# Patient Record
Sex: Female | Born: 2000 | Race: White | Hispanic: No | Marital: Single | Smoking: Never smoker
Health system: Southern US, Community
[De-identification: ages and names within clinical notes are randomized; demographics above are authoritative.]

## PROBLEM LIST (undated history)

## (undated) DIAGNOSIS — F32A Depression, unspecified: Secondary | ICD-10-CM

## (undated) DIAGNOSIS — F329 Major depressive disorder, single episode, unspecified: Secondary | ICD-10-CM

## (undated) HISTORY — DX: Major depressive disorder, single episode, unspecified: F32.9

## (undated) HISTORY — DX: Depression, unspecified: F32.A

---
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
## 2021-02-27 ENCOUNTER — Ambulatory Visit: Payer: 59

## 2021-02-27 ENCOUNTER — Encounter: Payer: Self-pay | Admitting: Physician Assistant

## 2021-02-27 ENCOUNTER — Ambulatory Visit (INDEPENDENT_AMBULATORY_CARE_PROVIDER_SITE_OTHER): Payer: 59 | Admitting: Physician Assistant

## 2021-02-27 ENCOUNTER — Other Ambulatory Visit: Payer: Self-pay

## 2021-02-27 VITALS — BP 105/72 | HR 94 | Temp 98.2°F | Ht 66.0 in | Wt 124.4 lb

## 2021-02-27 DIAGNOSIS — F411 Generalized anxiety disorder: Secondary | ICD-10-CM

## 2021-02-27 DIAGNOSIS — Z3042 Encounter for surveillance of injectable contraceptive: Secondary | ICD-10-CM | POA: Diagnosis not present

## 2021-02-27 MED ORDER — MEDROXYPROGESTERONE ACETATE 150 MG/ML IM SUSP
150.0000 mg | Freq: Once | INTRAMUSCULAR | Status: AC
Start: 2021-02-27 — End: 2021-02-27
  Administered 2021-02-27: 150 mg via INTRAMUSCULAR

## 2021-02-27 NOTE — Progress Notes (Signed)
Established Patient Office Visit  Subjective:  Patient ID: Morgan Oconnell, female    DOB: 10/23/2000  Age: 20 y.o. MRN: [id number]  CC:  Chief Complaint  Patient presents with   Contraception    HPI Morgan Oconnell presents for follow up on mood. Patient reports anxiety is better. 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 States has a good support system at home (family and friends). Denies SI/HI. States she constantly worries especially with new job she started but overall managing fine. Also here for depo provera injection. Reports tolerating injections without issues.       Past Medical History:  Diagnosis Date   Depression     History reviewed. No pertinent surgical history.  Family History  Problem Relation Age of Onset   Depression Maternal Grandmother    Hyperlipidemia Maternal Grandmother    Alcohol abuse Maternal Grandfather    Healthy Mother    Cancer Cousin     Social History   Socioeconomic History   Marital status: Single    Spouse name: Not on file   Number of children: Not on file   Years of education: Not on file   Highest education level: Not on file  Occupational History   Not on file  Tobacco Use   Smoking status: Never   Smokeless tobacco: Never  Vaping Use   Vaping Use: Never used  Substance and Sexual Activity   Alcohol use: Never   Drug use: Never   Sexual activity: Never  Other Topics Concern   Not on file  Social History Narrative   Not on file   Social Determinants of Health   Financial Resource Strain: Not on file  Food Insecurity: Not on file  Transportation Needs: Not on file  Physical Activity: Not on file  Stress: Not on file  Social Connections: Not on file  Intimate Partner Violence: Not on file    Outpatient Medications Prior to Visit  Medication Sig Dispense Refill   medroxyPROGESTERone Acetate 150 MG/ML SUSY Inject 1 mL into the muscle every 3  (three) months.     triamcinolone cream (KENALOG) 0.1 % Apply 1 application topically 2 (two) times daily. 30 g 0   medroxyPROGESTERone (DEPO-PROVERA) 150 MG/ML injection Inject 1 mL (150 mg total) into the muscle every 3 (three) months. 1 mL 3   No facility-administered medications prior to visit.    No Known Allergies  ROS Review of Systems A fourteen system review of systems was performed and found to be positive as per HPI.  Objective:    Physical Exam General:  Well Developed, well nourished, appropriate for stated age.  Neuro:  Alert and oriented,  extra-ocular muscles intact  HEENT:  Normocephalic, atraumatic, neck supple Skin:  no gross rash, warm, pink. Cardiac:  RRR, S1 S2 Respiratory: CTA B/L, Not using accessory muscles, speaking in full sentences- unlabored. Vascular:  Ext warm, no cyanosis apprec.; cap RF less 2 sec. Psych:  No HI/SI, judgement and insight good, Euthymic mood. Full Affect.  BP 105/72   Pulse 94   Temp 98.2 F (36.8 C)   Ht '5\' 6"'  (1.676 m)   Wt 124 lb 6.4 oz (56.4 kg)   SpO2 98%   BMI 20.08 kg/m  Wt Readings from Last 3 Encounters:  02/27/21 124 lb 6.4 oz (56.4 kg) (44 %, Z= -0.15)*  12/12/20 122 lb 12.8 oz (55.7 kg) (42 %, Z= -0.21)*  09/20/20  121 lb 1.6 oz (54.9 kg) (39 %, Z= -0.27)*   * Growth percentiles are based on CDC (Girls, 2-20 Years) data.     Health Maintenance Due  Topic Date Due   HPV VACCINES (1 - 2-dose series) Never done   HIV Screening  Never done   Hepatitis C Screening  Never done   INFLUENZA VACCINE  Never done       Topic Date Due   HPV VACCINES (1 - 2-dose series) Never done    Lab Results  Component Value Date   TSH 3.840 09/12/2020   Lab Results  Component Value Date   WBC 4.9 09/12/2020   HGB 12.6 09/12/2020   HCT 37.1 09/12/2020   MCV 90 09/12/2020   PLT 277 03/11/2020   Lab Results  Component Value Date   NA 140 09/12/2020   K 3.8 09/12/2020   CO2 19 (L) 09/12/2020   GLUCOSE 35 (LL)  09/12/2020   BUN 8 09/12/2020   CREATININE 0.63 09/12/2020   BILITOT 0.3 09/12/2020   ALKPHOS 83 09/12/2020   AST 18 09/12/2020   ALT 11 09/12/2020   PROT 6.7 09/12/2020   ALBUMIN 4.8 09/12/2020   CALCIUM 9.1 09/12/2020   ANIONGAP 10 03/11/2020   EGFR 131 09/12/2020   No results found for: CHOL No results found for: HDL No results found for: LDLCALC No results found for: TRIG No results found for: CHOLHDL Lab Results  Component Value Date   HGBA1C 4.9 09/12/2020      Assessment & Plan:   Problem List Items Addressed This Visit       Other   GAD (generalized anxiety disorder) - Primary   Other Visit Diagnoses     Encounter for Depo-Provera contraception       Relevant Medications   medroxyPROGESTERone (DEPO-PROVERA) injection 150 mg (Completed)      GAD: -Surprisingly GAD-7 score today is higher than previously. Patient reports anxiety is better and during encounter patient is non-tearful and euthymic, clinical improvement when compared at last visit. Encourage to continue using good support system and recommend mindfulness therapy. Will continue to monitor.  Encounter for Depo-Provera contraception: -UTD annual exam and compliant with injections every 3 months. -Will continue to monitor.  Meds ordered this encounter  Medications   medroxyPROGESTERone (DEPO-PROVERA) injection 150 mg    Follow-up: Return in about 12 weeks (around 05/22/2021) for Next Depo injection; CPE in June 2023 .    Lorrene Reid, PA-C

## 2021-02-27 NOTE — Progress Notes (Signed)
HPI: Patient is here for a Depo Provera injection. Denies chest pain, shortness of breath, headaches, mood changes or problems with medication.   Assessment and Plan: Injection administered in the left glute. Patient tolerated injection well without complications. Patient advised to schedule next injection in 12 weeks.

## 2021-02-27 NOTE — Patient Instructions (Signed)

## 2021-05-22 ENCOUNTER — Other Ambulatory Visit: Payer: Self-pay

## 2021-05-22 ENCOUNTER — Other Ambulatory Visit: Payer: Self-pay | Admitting: Physician Assistant

## 2021-05-22 ENCOUNTER — Ambulatory Visit (INDEPENDENT_AMBULATORY_CARE_PROVIDER_SITE_OTHER): Payer: 59 | Admitting: Nurse Practitioner

## 2021-05-22 VITALS — BP 91/57 | HR 6 | Temp 98.7°F | Ht 66.5 in | Wt 130.0 lb

## 2021-05-22 DIAGNOSIS — Z3009 Encounter for other general counseling and advice on contraception: Secondary | ICD-10-CM | POA: Diagnosis not present

## 2021-05-22 MED ORDER — MEDROXYPROGESTERONE ACETATE 150 MG/ML IM SUSP
150.0000 mg | Freq: Once | INTRAMUSCULAR | Status: AC
  Administered 2021-05-22: 150 mg via INTRAMUSCULAR

## 2021-05-22 NOTE — Progress Notes (Signed)
Patient in office for Depo shot. Patient is within the window to receive the injection. Patient having no complications. Injection given, tolerated well. Patient advised to follow up 08/07/21-08/21/21 for next injection. AS, CMA

## 2021-08-16 ENCOUNTER — Ambulatory Visit: Payer: 59 | Admitting: Nurse Practitioner

## 2021-08-16 ENCOUNTER — Ambulatory Visit: Payer: 59

## 2021-08-22 ENCOUNTER — Encounter: Payer: Self-pay | Admitting: Nurse Practitioner

## 2021-08-22 ENCOUNTER — Ambulatory Visit (INDEPENDENT_AMBULATORY_CARE_PROVIDER_SITE_OTHER): Payer: 59 | Admitting: Nurse Practitioner

## 2021-08-22 VITALS — BP 100/65 | HR 74 | Temp 98.0°F | Ht 66.54 in | Wt 134.0 lb

## 2021-08-22 DIAGNOSIS — N926 Irregular menstruation, unspecified: Secondary | ICD-10-CM | POA: Diagnosis not present

## 2021-08-22 DIAGNOSIS — Z3009 Encounter for other general counseling and advice on contraception: Secondary | ICD-10-CM

## 2021-08-22 NOTE — Progress Notes (Signed)
Established patient visit   Patient: Morgan Oconnell   DOB: Jun 26, 2000   20 y.o. Female  MRN: [id number] Visit Date: 08/22/2021   Chief Complaint  Patient presents with   Contraception   Subjective    HPI  Patient states that she is due to have depo-provera injection today.  -has had spotting over the last few months.  -interested in trying other forms of birth control. Feels like this one is not working as it should  -states that she does have a boyfriend and they are sexually active. Not trying to conceive a pregnancy at this time.  -she has no other concerns or complaints today.    Medications: Outpatient Medications Prior to Visit  Medication Sig   medroxyPROGESTERone Acetate 150 MG/ML SUSY INJECT 1 ML INTO THE MUSCLE EVERY 3 MONTHS.   [DISCONTINUED] triamcinolone cream (KENALOG) 0.1 % Apply 1 application topically 2 (two) times daily.   No facility-administered medications prior to visit.    Review of Systems  Constitutional:  Negative for activity change, appetite change, chills, fatigue and fever.  HENT:  Negative for congestion, postnasal drip, rhinorrhea, sinus pressure, sinus pain, sneezing and sore throat.   Eyes: Negative.   Respiratory:  Negative for cough, chest tightness, shortness of breath and wheezing.   Cardiovascular:  Negative for chest pain and palpitations.  Gastrointestinal:  Negative for abdominal pain, constipation, diarrhea, nausea and vomiting.  Endocrine: Negative for cold intolerance, heat intolerance, polydipsia and polyuria.  Genitourinary:  Negative for dyspareunia, dysuria, flank pain, frequency and urgency.       Spotting and vaginal bleeding in between depo-provera injection .  Musculoskeletal:  Negative for arthralgias, back pain and myalgias.  Skin:  Negative for rash.  Allergic/Immunologic: Negative for environmental allergies.  Neurological:  Negative for dizziness, weakness and headaches.  Hematological:  Negative for adenopathy.   Psychiatric/Behavioral:  The patient is not nervous/anxious.     Objective     Today's Vitals   08/22/21 1527  BP: 100/65  Pulse: 74  Temp: 98 F (36.7 C)  SpO2: 100%  Weight: 134 lb (60.8 kg)  Height: 5' 6.54" (1.69 m)   Body mass index is 21.28 kg/m.   BP Readings from Last 3 Encounters:  08/22/21 100/65  05/22/21 (!) 91/57  02/27/21 105/72    Wt Readings from Last 3 Encounters:  08/22/21 134 lb (60.8 kg) (60 %, Z= 0.25)*  05/22/21 130 lb (59 kg) (54 %, Z= 0.10)*  02/27/21 124 lb 6.4 oz (56.4 kg) (44 %, Z= -0.15)*   * Growth percentiles are based on CDC (Girls, 2-20 Years) data.    Physical Exam Vitals and nursing note reviewed.  Constitutional:      Appearance: Normal appearance. She is well-developed.  HENT:     Head: Normocephalic.  Eyes:     Pupils: Pupils are equal, round, and reactive to light.  Cardiovascular:     Rate and Rhythm: Normal rate and regular rhythm.     Pulses: Normal pulses.     Heart sounds: Normal heart sounds.  Pulmonary:     Effort: Pulmonary effort is normal.     Breath sounds: Normal breath sounds.  Abdominal:     Palpations: Abdomen is soft.  Musculoskeletal:        General: Normal range of motion.     Cervical back: Normal range of motion and neck supple.  Lymphadenopathy:     Cervical: No cervical adenopathy.  Skin:    General: Skin is warm and dry.  Capillary Refill: Capillary refill takes less than 2 seconds.  Neurological:     General: No focal deficit present.     Mental Status: She is alert and oriented to person, place, and time.  Psychiatric:        Mood and Affect: Mood normal.        Behavior: Behavior normal.        Thought Content: Thought content normal.        Judgment: Judgment normal.      Assessment & Plan    1. Irregular menstrual bleeding The patient is having vaginal spotting/bleeding in between menstrual bleeding.  2. Birth control counseling The patient would like to stop Depo-Provera  injections at this time.  We have discussed different forms of birth control including transdermal patches, vaginal rings, birth control pills, and IUDs.  Patient states she would like to hold off birth control at this time.  Would like to follow-up in a few months to discuss further.  Problem List Items Addressed This Visit       Other   Irregular menstrual bleeding - Primary   Other Visit Diagnoses     Birth control counseling            Return in about 3 months (around 11/22/2021) for health maintenance exam, FBW a week prior to visit.         Carlean Jews, NP  [facility] [phone] (phone) [phone] (fax)  [facility]

## 2021-08-26 DIAGNOSIS — N926 Irregular menstruation, unspecified: Secondary | ICD-10-CM | POA: Insufficient documentation

## 2021-09-04 ENCOUNTER — Encounter: Payer: 59 | Admitting: Nurse Practitioner

## 2021-09-28 ENCOUNTER — Ambulatory Visit
Admission: EM | Admit: 2021-09-28 | Discharge: 2021-09-28 | Disposition: A | Discharge status: Home / Self Care | Payer: 59 | Attending: Physician Assistant | Admitting: Physician Assistant

## 2021-09-28 DIAGNOSIS — J069 Acute upper respiratory infection, unspecified: Secondary | ICD-10-CM | POA: Diagnosis not present

## 2021-09-28 DIAGNOSIS — R051 Acute cough: Secondary | ICD-10-CM

## 2021-09-28 MED ORDER — PREDNISONE 20 MG PO TABS: 40.0000 mg | ORAL_TABLET | Freq: Every day | ORAL | 0 refills | Status: AC

## 2021-09-28 MED ORDER — PROMETHAZINE-DM 6.25-15 MG/5ML PO SYRP: 5.0000 mL | ORAL_SOLUTION | Freq: Three times a day (TID) | ORAL | 0 refills | Status: DC | PRN

## 2021-09-28 NOTE — Discharge Instructions (Signed)
I believe that you have a virus.  Please monitor your MyChart for your results we will contact you if anything is positive.  Take prednisone burst.  Do not take NSAIDs including aspirin, ibuprofen/Advil, naproxen/Aleve with this medication.  You can use Tylenol, Mucinex, Flonase for additional symptom relief.  You can use Promethazine DM up to 3 times a day as needed for cough.  This can make you sleepy so do not drive or drink alcohol while taking it.  Make sure you rest and drink plenty of fluids.  If your symptoms are not improving within a week return for reevaluation.  If anything worsens return immediately.

## 2021-09-28 NOTE — ED Triage Notes (Signed)
Pt c/o coughing x 3 days. States today she had 2 "cough-gag fits" and was sent home from work.
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
## 2021-09-29 LAB — COVID-19, FLU A+B NAA
Influenza A, NAA: NOT DETECTED
Influenza B, NAA: NOT DETECTED
SARS-CoV-2, NAA: NOT DETECTED

## 2021-11-15 ENCOUNTER — Emergency Department (HOSPITAL_COMMUNITY): Payer: 59

## 2021-11-15 ENCOUNTER — Emergency Department (HOSPITAL_COMMUNITY)
Admission: EM | Admit: 2021-11-15 | Discharge: 2021-11-16 | Disposition: A | Discharge status: Home / Self Care | Payer: 59 | Attending: Emergency Medicine | Admitting: Emergency Medicine

## 2021-11-15 ENCOUNTER — Other Ambulatory Visit: Payer: Self-pay

## 2021-11-15 ENCOUNTER — Encounter (HOSPITAL_COMMUNITY): Payer: Self-pay | Admitting: Emergency Medicine

## 2021-11-15 DIAGNOSIS — Z5321 Procedure and treatment not carried out due to patient leaving prior to being seen by health care provider: Secondary | ICD-10-CM | POA: Diagnosis not present

## 2021-11-15 DIAGNOSIS — R109 Unspecified abdominal pain: Secondary | ICD-10-CM | POA: Insufficient documentation

## 2021-11-15 LAB — URINALYSIS, ROUTINE W REFLEX MICROSCOPIC
Bilirubin Urine: NEGATIVE
Glucose, UA: NEGATIVE mg/dL
Hgb urine dipstick: NEGATIVE
Ketones, ur: NEGATIVE mg/dL
Nitrite: NEGATIVE
Protein, ur: NEGATIVE mg/dL
Specific Gravity, Urine: 1.017 (ref 1.005–1.030)
pH: 5 (ref 5.0–8.0)

## 2021-11-15 LAB — CBC WITH DIFFERENTIAL/PLATELET
Abs Immature Granulocytes: 0.02 10*3/uL (ref 0.00–0.07)
Basophils Absolute: 0 10*3/uL (ref 0.0–0.1)
Basophils Relative: 1 %
Eosinophils Absolute: 0.1 10*3/uL (ref 0.0–0.5)
Eosinophils Relative: 1 %
HCT: 36.5 % (ref 36.0–46.0)
Hemoglobin: 12.3 g/dL (ref 12.0–15.0)
Immature Granulocytes: 0 %
Lymphocytes Relative: 30 %
Lymphs Abs: 2.2 10*3/uL (ref 0.7–4.0)
MCH: 30 pg (ref 26.0–34.0)
MCHC: 33.7 g/dL (ref 30.0–36.0)
MCV: 89 fL (ref 80.0–100.0)
Monocytes Absolute: 0.7 10*3/uL (ref 0.1–1.0)
Monocytes Relative: 9 %
Neutro Abs: 4.3 10*3/uL (ref 1.7–7.7)
Neutrophils Relative %: 59 %
Platelets: 235 10*3/uL (ref 150–400)
RBC: 4.1 MIL/uL (ref 3.87–5.11)
RDW: 12 % (ref 11.5–15.5)
WBC: 7.2 10*3/uL (ref 4.0–10.5)
nRBC: 0 % (ref 0.0–0.2)

## 2021-11-15 LAB — I-STAT BETA HCG BLOOD, ED (MC, WL, AP ONLY): I-stat hCG, quantitative: <5 m[IU]/mL (ref <5)

## 2021-11-15 LAB — COMPREHENSIVE METABOLIC PANEL
ALT: 25 U/L (ref 0–44)
AST: 26 U/L (ref 15–41)
Albumin: 4.3 g/dL (ref 3.5–5.0)
Alkaline Phosphatase: 83 U/L (ref 38–126)
Anion gap: 7 (ref 5–15)
BUN: 9 mg/dL (ref 6–20)
CO2: 23 mmol/L (ref 22–32)
Calcium: 9.3 mg/dL (ref 8.9–10.3)
Chloride: 109 mmol/L (ref 98–111)
Creatinine, Ser: 0.63 mg/dL (ref 0.44–1.00)
GFR, Estimated: >60 mL/min (ref >60)
Glucose, Bld: 83 mg/dL (ref 70–99)
Potassium: 3.8 mmol/L (ref 3.5–5.1)
Sodium: 139 mmol/L (ref 135–145)
Total Bilirubin: 0.2 mg/dL — ABNORMAL LOW (ref 0.3–1.2)
Total Protein: 6.8 g/dL (ref 6.5–8.1)

## 2021-11-15 LAB — LIPASE, BLOOD: Lipase: 26 U/L (ref 11–51)

## 2021-11-15 NOTE — ED Provider Triage Note (Signed)
Emergency Medicine Provider Triage Evaluation Note  Morgan Oconnell , a 21 y.o. female  was evaluated in triage.  Pt complains of flank pain. Acute onset of L flank pain since yesterday, worse with movement.  Does do some heavy lifting.  No hx of kidney stone, no fever, chills, n/v/d, abd pain, cp, sob, dysuria, hematuria or rash.  No trauma  Review of Systems  Positive: As above Negative: As above  Physical Exam  BP 123/80 (BP Location: Right Arm)   Pulse 86   Temp 98.6 F (37 C) (Oral)   Resp 16   SpO2 100%  Gen:   Awake, no distress   Resp:  Normal effort  MSK:   Moves extremities without difficulty  Other:    Medical Decision Making  Medically screening exam initiated at 5:02 PM.  Appropriate orders placed.  Justice Aguirre was informed that the remainder of the evaluation will be completed by another provider, this initial triage assessment does not replace that evaluation, and the importance of remaining in the ED until their evaluation is complete.     Fayrene Helper, PA-C 11/15/21 1708

## 2021-11-15 NOTE — ED Notes (Signed)
Pt called 3x no answer  

## 2021-11-15 NOTE — ED Triage Notes (Signed)
Pt with intensifying left flank pain since yesterday. Worse with eating/drinking but has continued to get worse.  No n/v/d shob or CP.  No urinary symptoms. No fever or chills.

## 2021-11-16 ENCOUNTER — Ambulatory Visit (INDEPENDENT_AMBULATORY_CARE_PROVIDER_SITE_OTHER): Payer: 59

## 2021-11-16 ENCOUNTER — Ambulatory Visit
Admission: EM | Admit: 2021-11-16 | Discharge: 2021-11-16 | Disposition: A | Discharge status: Home / Self Care | Payer: 59 | Attending: Physician Assistant | Admitting: Physician Assistant

## 2021-11-16 DIAGNOSIS — R109 Unspecified abdominal pain: Secondary | ICD-10-CM | POA: Diagnosis not present

## 2021-11-16 DIAGNOSIS — R0789 Other chest pain: Secondary | ICD-10-CM

## 2021-11-16 DIAGNOSIS — R10A2 Flank pain, left side: Secondary | ICD-10-CM

## 2021-11-16 DIAGNOSIS — R0781 Pleurodynia: Secondary | ICD-10-CM

## 2021-11-16 DIAGNOSIS — R0782 Intercostal pain: Secondary | ICD-10-CM

## 2021-11-16 LAB — POCT URINALYSIS DIP (MANUAL ENTRY)
Bilirubin, UA: NEGATIVE
Blood, UA: NEGATIVE
Glucose, UA: NEGATIVE mg/dL
Ketones, POC UA: NEGATIVE mg/dL
Leukocytes, UA: NEGATIVE
Nitrite, UA: NEGATIVE
Protein Ur, POC: NEGATIVE mg/dL
Spec Grav, UA: 1.02 (ref 1.010–1.025)
Urobilinogen, UA: 0.2 E.U./dL
pH, UA: 7 (ref 5.0–8.0)

## 2021-11-16 MED ORDER — IBUPROFEN 600 MG PO TABS: 600.0000 mg | ORAL_TABLET | Freq: Four times a day (QID) | ORAL | 0 refills | Status: DC | PRN

## 2021-11-16 MED ORDER — LIDOCAINE 5 % EX PTCH: 1.0000 | MEDICATED_PATCH | CUTANEOUS | 0 refills | Status: DC

## 2021-11-16 NOTE — Discharge Instructions (Signed)
Your x-ray was normal.  As we discussed, I am concerned about a musculoskeletal etiology since we are able to reproduce the pain on exam.  Start ibuprofen every 6 hours as needed.  Do not take NSAIDs with this medication including aspirin, ibuprofen/Advil, naproxen/Aleve as that will cause stomach bleeding.  Use lidocaine patches on this area for additional symptom relief.  You should put 1 patch on in the morning leave it on for 12 hours and then remove it for 12 hours at night (use only 1 patch per 24 hours).  If you have any worsening symptoms including worsening pain, fever, nausea, vomiting, urinary symptoms you need to go to the emergency room immediately.
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
## 2021-11-16 NOTE — ED Triage Notes (Signed)
Pt c/o left flank pain, pain with deep breathing,   Denies dysuria, frequency, urgency, pelvic pain, new lower back pain, hematuria.   Onset ~ Wednesday

## 2021-11-20 NOTE — Progress Notes (Unsigned)
Complete physical exam   Patient: Morgan Oconnell   DOB: 01-16-2001   21 y.o. Female  MRN: [id number] Visit Date: 11/21/2021    No chief complaint on file.  Subjective    Morgan Oconnell is a 21 y.o. female who presents today for a complete physical exam.  She reports consuming a {diet types:17450} diet. {Exercise:19826} She generally feels {well/fairly well/poorly:18703}. She {does/does not:200015} have additional problems to discuss today.   HPI  Annual physical.  -trial of OCP due to irregular menstrual cycle.  -was to have routine, fasting labs prior to this visit. Will get today if she is fasting.   Past Medical History:  Diagnosis Date   Depression    No past surgical history on file. Social History   Socioeconomic History   Marital status: Single    Spouse name: Not on file   Number of children: Not on file   Years of education: Not on file   Highest education level: Not on file  Occupational History   Not on file  Tobacco Use   Smoking status: Never   Smokeless tobacco: Never  Vaping Use   Vaping Use: Never used  Substance and Sexual Activity   Alcohol use: Never   Drug use: Never   Sexual activity: Never  Other Topics Concern   Not on file  Social History Narrative   Not on file   Social Determinants of Health   Financial Resource Strain: Not on file  Food Insecurity: Not on file  Transportation Needs: Not on file  Physical Activity: Not on file  Stress: Not on file  Social Connections: Unknown (03/10/2018)   Social Connection and Isolation Panel [NHANES]    Frequency of Communication with Friends and Family: Not on file    Frequency of Social Gatherings with Friends and Family: Not on file    Attends Religious Services: 1 to 4 times per year    Active Member of Clubs or Organizations: No    Attends Archivist Meetings: Never    Marital Status: Not on file  Intimate Partner Violence: Not on file   Family Status  Relation Name Status    MGM  (Not Specified)   MGF  (Not Specified)   Mother  Alive   Brother Barrie Dunker, age 68y   Brother Lenny Pastel, age 37y   Brother Marijo File, age 8y   Cousin  Alive   Family History  Problem Relation Age of Onset   Depression Maternal Grandmother    Hyperlipidemia Maternal Grandmother    Alcohol abuse Maternal Grandfather    Healthy Mother    Cancer Cousin    No Known Allergies  Patient Care Team: Ronnell Freshwater, NP as PCP - General (Family Medicine)   Medications: Outpatient Medications Prior to Visit  Medication Sig   ibuprofen (ADVIL) 600 MG tablet Take 1 tablet (600 mg total) by mouth every 6 (six) hours as needed.   lidocaine (LIDODERM) 5 % Place 1 patch onto the skin daily. Remove & Discard patch within 12 hours or as directed by MD   No facility-administered medications prior to visit.    Review of Systems  {Labs (Optional):23779}   Objective    There were no vitals taken for this visit. BP Readings from Last 3 Encounters:  11/16/21 96/64  11/15/21 133/77  09/28/21 100/70    Wt Readings from Last 3 Encounters:  08/22/21 134 lb (60.8 kg) (60 %, Z= 0.25)*  05/22/21 130 lb (59 kg) (54 %,  Z= 0.10)*  02/27/21 124 lb 6.4 oz (56.4 kg) (44 %, Z= -0.15)*   * Growth percentiles are based on CDC (Girls, 2-20 Years) data.     Physical Exam  ***  Last depression screening scores    08/22/2021    3:29 PM 02/27/2021    2:42 PM 12/12/2020    2:14 PM  PHQ 2/9 Scores  PHQ - 2 Score 1 1 0  PHQ- 9 Score '2 3 5   ' Last fall risk screening    02/27/2021    2:42 PM  [place] in the past year? 0  Number falls in past yr: 0  Injury with Fall? 0  Risk for fall due to : No Fall Risks  Follow up Falls evaluation completed   Last Audit-C alcohol use screening     No data to display         A score of 3 or more in women, and 4 or more in men indicates increased risk for alcohol abuse, EXCEPT if all of the points are from question 1   No  results found for any visits on 11/21/21.  Assessment & Plan    Routine Health Maintenance and Physical Exam  Exercise Activities and Dietary recommendations  Goals   None     Immunization History  Administered Date(s) Administered   DTaP 11/12/2001, 01/22/2002, 04/22/2002, 02/08/2003   Hepatitis B 30-Mar-2001, 11/12/2001, 06/24/2002   HiB (PRP-OMP) 11/12/2001, 01/22/2002, 04/22/2002, 09/19/2002   HiB (PRP-T) 11/12/2001, 01/22/2002, 04/22/2002, 09/19/2002   MMR 09/19/2002, 09/22/2006   Meningococcal Conjugate 12/01/2012   Pneumococcal Conjugate-13 11/12/2001, 01/22/2002, 06/24/2002, 09/22/2006   Tdap 12/01/2012   Varicella 02/08/2003, 09/22/2006    Health Maintenance  Topic Date Due   COVID-19 Vaccine (1) Never done   HPV VACCINES (1 - 2-dose series) Never done   HIV Screening  Never done   Hepatitis C Screening  Never done   INFLUENZA VACCINE  10/23/2021   TETANUS/TDAP  12/02/2022    Discussed health benefits of physical activity, and encouraged her to engage in regular exercise appropriate for her age and condition.  Problem List Items Addressed This Visit   None    No follow-ups on file.        Ronnell Freshwater, NP  [facility] [phone] (phone) [phone] (fax)  [place]

## 2021-11-21 ENCOUNTER — Encounter: Payer: Self-pay | Admitting: Nurse Practitioner

## 2021-11-21 ENCOUNTER — Ambulatory Visit (INDEPENDENT_AMBULATORY_CARE_PROVIDER_SITE_OTHER): Payer: 59 | Admitting: Nurse Practitioner

## 2021-11-21 VITALS — BP 101/68 | HR 82 | Ht 66.54 in | Wt 137.1 lb

## 2021-11-21 DIAGNOSIS — N83202 Unspecified ovarian cyst, left side: Secondary | ICD-10-CM

## 2021-11-21 DIAGNOSIS — R1032 Left lower quadrant pain: Secondary | ICD-10-CM

## 2021-11-21 DIAGNOSIS — Z0001 Encounter for general adult medical examination with abnormal findings: Secondary | ICD-10-CM
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
## 2022-11-18 ENCOUNTER — Other Ambulatory Visit: Payer: 59

## 2022-11-18 ENCOUNTER — Other Ambulatory Visit: Payer: Self-pay | Admitting: Family Medicine

## 2022-11-18 DIAGNOSIS — Z0000 Encounter for general adult medical examination without abnormal findings: Secondary | ICD-10-CM

## 2022-11-18 DIAGNOSIS — R5383 Other fatigue: Secondary | ICD-10-CM

## 2022-11-19 LAB — COMPREHENSIVE METABOLIC PANEL
ALT: 7 IU/L (ref 0–32)
AST: 18 IU/L (ref 0–40)
Albumin: 5 g/dL (ref 4.0–5.0)
Alkaline Phosphatase: 93 IU/L (ref 44–121)
BUN/Creatinine Ratio: 11 (ref 9–23)
BUN: 7 mg/dL (ref 6–20)
Bilirubin Total: 0.2 mg/dL (ref 0.0–1.2)
CO2: 22 mmol/L (ref 20–29)
Calcium: 9.8 mg/dL (ref 8.7–10.2)
Chloride: 104 mmol/L (ref 96–106)
Creatinine, Ser: 0.61 mg/dL (ref 0.57–1.00)
Globulin, Total: 2.2 g/dL (ref 1.5–4.5)
Glucose: 96 mg/dL (ref 70–99)
Potassium: 4.3 mmol/L (ref 3.5–5.2)
Sodium: 140 mmol/L (ref 134–144)
Total Protein: 7.2 g/dL (ref 6.0–8.5)
eGFR: 130 mL/min/{1.73_m2} (ref >59)

## 2022-11-19 LAB — LIPID PANEL
Chol/HDL Ratio: 2.3 ratio (ref 0.0–4.4)
Cholesterol, Total: 176 mg/dL (ref 100–199)
HDL: 77 mg/dL (ref >39)
LDL Chol Calc (NIH): 88 mg/dL (ref 0–99)
Triglycerides: 56 mg/dL (ref 0–149)
VLDL Cholesterol Cal: 11 mg/dL (ref 5–40)

## 2022-11-19 LAB — CBC
Hematocrit: 41 % (ref 34.0–46.6)
Hemoglobin: 13.3 g/dL (ref 11.1–15.9)
MCH: 29.2 pg (ref 26.6–33.0)
MCHC: 32.4 g/dL (ref 31.5–35.7)
MCV: 90 fL (ref 79–97)
Platelets: 275 10*3/uL (ref 150–450)
RBC: 4.55 x10E6/uL (ref 3.77–5.28)
RDW: 12.2 % (ref 11.7–15.4)
WBC: 5.6 10*3/uL (ref 3.4–10.8)

## 2022-11-19 LAB — TSH: TSH: 5.8 u[IU]/mL — ABNORMAL HIGH (ref 0.450–4.500)

## 2022-11-19 LAB — HEMOGLOBIN A1C
Est. average glucose Bld gHb Est-mCnc: 103 mg/dL
Hgb A1c MFr Bld: 5.2 % (ref 4.8–5.6)

## 2022-11-27 ENCOUNTER — Encounter: Payer: 59 | Admitting: Family Medicine

## 2023-01-08 ENCOUNTER — Encounter: Payer: 59 | Admitting: Family Medicine

## 2023-09-18 ENCOUNTER — Ambulatory Visit
Admission: EM | Admit: 2023-09-18 | Discharge: 2023-09-18 | Disposition: A | Discharge status: Home / Self Care | Payer: Self-pay

## 2023-09-18 DIAGNOSIS — B354 Tinea corporis: Secondary | ICD-10-CM

## 2023-09-18 MED ORDER — KETOCONAZOLE 2 % EX CREA: 1.0000 | TOPICAL_CREAM | Freq: Every day | CUTANEOUS | 0 refills | Status: AC

## 2023-09-18 NOTE — ED Triage Notes (Signed)
 On my birthday, when getting dressed I found a rash/spot on the inside of my right ankle, a little itchy with pain.

## 2023-09-21 ENCOUNTER — Encounter: Payer: Self-pay | Admitting: Physician Assistant
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
